# Patient Record
Sex: Female | Born: 1995 | Race: White | Hispanic: No | Marital: Single | Smoking: Never smoker
Health system: Southern US, Community
[De-identification: ages and names within clinical notes are randomized; demographics above are authoritative.]

## PROBLEM LIST (undated history)

## (undated) DIAGNOSIS — F32A Depression, unspecified: Secondary | ICD-10-CM

## (undated) DIAGNOSIS — F988 Other specified behavioral and emotional disorders with onset usually occurring in childhood and adolescence: Secondary | ICD-10-CM

## (undated) DIAGNOSIS — F329 Major depressive disorder, single episode, unspecified: Secondary | ICD-10-CM

## (undated) DIAGNOSIS — G43909 Migraine, unspecified, not intractable, without status migrainosus: Secondary | ICD-10-CM

## (undated) DIAGNOSIS — I456 Pre-excitation syndrome: Secondary | ICD-10-CM

## (undated) DIAGNOSIS — F419 Anxiety disorder, unspecified: Secondary | ICD-10-CM

## (undated) HISTORY — DX: Anxiety disorder, unspecified: F41.9

## (undated) HISTORY — DX: Major depressive disorder, single episode, unspecified: F32.9

## (undated) HISTORY — PX: WISDOM TOOTH EXTRACTION: SHX21

## (undated) HISTORY — DX: Other specified behavioral and emotional disorders with onset usually occurring in childhood and adolescence: F98.8

## (undated) HISTORY — DX: Depression, unspecified: F32.A

## (undated) HISTORY — DX: Pre-excitation syndrome: I45.6

## (undated) HISTORY — DX: Migraine, unspecified, not intractable, without status migrainosus: G43.909

---
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
## 2016-06-07 ENCOUNTER — Ambulatory Visit (INDEPENDENT_AMBULATORY_CARE_PROVIDER_SITE_OTHER): Payer: BLUE CROSS/BLUE SHIELD | Admitting: Allergy and Immunology

## 2016-06-07 ENCOUNTER — Encounter: Payer: Self-pay | Admitting: Allergy and Immunology

## 2016-06-07 VITALS — BP 104/68 | HR 82 | Temp 98.6°F | Resp 16 | Ht 63.98 in | Wt 154.3 lb

## 2016-06-07 DIAGNOSIS — H1045 Other chronic allergic conjunctivitis: Secondary | ICD-10-CM

## 2016-06-07 DIAGNOSIS — J3089 Other allergic rhinitis: Secondary | ICD-10-CM

## 2016-06-07 DIAGNOSIS — Z8669 Personal history of other diseases of the nervous system and sense organs: Secondary | ICD-10-CM | POA: Insufficient documentation

## 2016-06-07 DIAGNOSIS — R198 Other specified symptoms and signs involving the digestive system and abdomen: Secondary | ICD-10-CM | POA: Diagnosis not present

## 2016-06-07 DIAGNOSIS — H1010 Acute atopic conjunctivitis, unspecified eye: Secondary | ICD-10-CM | POA: Insufficient documentation

## 2016-06-07 MED ORDER — FLUTICASONE PROPIONATE 50 MCG/ACT NA SUSP: 2.0000 | Freq: Every day | NASAL | 5 refills | Status: AC | PRN

## 2016-06-07 MED ORDER — LEVOCETIRIZINE DIHYDROCHLORIDE 5 MG PO TABS: 5.0000 mg | ORAL_TABLET | Freq: Every day | ORAL | 5 refills | Status: DC | PRN

## 2016-06-07 MED ORDER — EPINEPHRINE 0.3 MG/0.3ML IJ SOAJ: INTRAMUSCULAR | 2 refills | Status: DC

## 2016-06-07 MED ORDER — OLOPATADINE HCL 0.7 % OP SOLN: 1.0000 [drp] | OPHTHALMIC | 5 refills | Status: DC

## 2016-06-07 NOTE — Progress Notes (Signed)
New Patient Note  RE: Chelsea Rush MRN: [id number] DOB: Jun 27, 1996 Date of Office Visit: 06/07/2016  Referring provider: No ref. provider found Primary care provider: Pcp Not In System  Chief Complaint: Allergic Rhinitis ; Conjunctivitis; Food Intolerance; and Headache   History of present illness: Chelsea Rush is a 20 y.o. female presenting today for consultation of rhinitis and possible food allergies.  She complains of nasal congestion, rhinorrhea, sneezing, nasal pruritus, watery/itchy eyes, and occasional sinus pressure between the eyes and over the cheekbones.  These symptoms occur year around but her more frequent and severe in the summer and fall.  She notes that she has had recurrent migraines since the ninth grade.  She currently experiences 2-3 migraines per week.  She believes specific triggers may include nuts, starchy foods, caffeine, and/or chocolate.  She also wonders if there are environmental triggers for her migraines.  Alara reports that she experiences abdominal cramping and bloating with the consumption of dairy products.  She does not experience concomitant cardiopulmonary or cutaneous symptoms.  She has not tried lactose-free dairy products.   Assessment and plan: Perennial and seasonal allergic rhinitis  Aeroallergen avoidance measures have been discussed and provided in written form.  A prescription has been provided for levocetirizine, 5mg  daily as needed.  A prescription has been provided for fluticasone nasal spray, 2 sprays per nostril daily as needed. Proper nasal spray technique has been discussed and demonstrated.  I have also recommended nasal saline spray (i.e., Simply Saline) or nasal saline lavage (i.e., NeilMed) as needed prior to medicated nasal sprays.  If allergen avoidance measures and medications fail to adequately relieve symptoms, aeroallergen immunotherapy will be considered.  Seasonal allergic conjunctivitis  Treatment plan as  outlined above for allergic rhinitis.  A prescription has been provided for Pazeo, one drop per eye daily as needed.  Migraine headaches  As migraines may be exacerbated or triggered by sinus symptoms, treatment plan as outlined above for allergic rhinitis.  Continue avoidance of known triggers and treatment plan as outlined by neurologist.  GI symptoms Gastrointestinal symptoms, uncertain etiology. Skin tests to select food allergens were negative today. The negative predictive value of food allergen skin testing is excellent (approximately 95%). While this does not appear to be an IgE mediated issue, skin testing does not rule out food intolerances or cell-mediated enteropathies which may lend to GI symptoms. These etiologies are suggested when elimination of the responsible food leads to symptom resolution and re-introduction of the food is followed by the return of symptoms.   The patient has been encouraged to try Lactaid products and keep a careful symptom/food journal and eliminate any food suspected of correlating with symptoms.   If GI symptoms persist or progress, gastroenterologist evaluation may be warranted.   Meds ordered this encounter  Medications  . levocetirizine (XYZAL) 5 MG tablet    Sig: Take 1 tablet (5 mg total) by mouth daily as needed for allergies.    Dispense:  30 tablet    Refill:  5  . fluticasone (FLONASE) 50 MCG/ACT nasal spray    Sig: Place 2 sprays into both nostrils daily as needed for allergies or rhinitis.    Dispense:  16 g    Refill:  5  . EPINEPHrine (EPIPEN 2-PAK) 0.3 mg/0.3 mL IJ SOAJ injection    Sig: Use as directed for severe allergic reactions    Dispense:  2 Device    Refill:  2  . Olopatadine HCl (PAZEO) 0.7 % SOLN  Sig: Place 1 drop into both eyes 1 day or 1 dose.    Dispense:  1 Bottle    Refill:  5    Diagnositics: Environmental skin testing: Positive to grass pollen, weed pollen, ragweed pollen, tree pollen, mold, cat hair,  dog epithelia, dust mite, cockroach antigen. Food allergen skin testing:  Negative despite a positive histamine control.    Physical examination: Blood pressure 104/68, pulse 82, temperature 98.6 F (37 C), temperature source Oral, resp. rate 16, height 5' 3.98" (1.625 m), weight 154 lb 5.2 oz (70 kg).  General: Alert, interactive, in no acute distress. HEENT: TMs pearly gray, turbinates markedly edematous and pale without discharge, post-pharynx mildly erythematous. Neck: Supple without lymphadenopathy. Lungs: Clear to auscultation without wheezing, rhonchi or rales. CV: Normal S1, S2 without murmurs. Abdomen: Nondistended, nontender. Skin: Warm and dry, without lesions or rashes. Extremities:  No clubbing, cyanosis or edema. Neuro:   Grossly intact.  Review of systems:  Review of systems negative except as noted in HPI / PMHx or noted below: Review of Systems  Constitutional: Negative.   HENT: Negative.   Eyes: Negative.   Respiratory: Negative.   Cardiovascular: Negative.   Gastrointestinal: Negative.   Genitourinary: Negative.   Musculoskeletal: Negative.   Skin: Negative.   Neurological: Negative.   Endo/Heme/Allergies: Negative.   Psychiatric/Behavioral: Negative.     Past medical history:  Past Medical History:  Diagnosis Date  . ADD (attention deficit disorder)   . Anxiety   . Depression   . Migraines   . Wolff-Parkinson-White syndrome     Past surgical history:  Past Surgical History:  Procedure Laterality Date  . 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 WISDOM TOOTH EXTRACTION      Family history: Family History  Problem Relation Age of Onset  . Allergic rhinitis Father   . Asthma Father   . Angioedema Neg Hx   . Eczema Neg Hx   . Immunodeficiency Neg Hx   . Urticaria Neg Hx     Social history: Social History   Social History  . Marital status: Single    Spouse name: N/A  . Number of children: N/A  . Years of education: N/A    Occupational History  . Not on file.   Social History Main Topics  . Smoking status: Never Smoker  . Smokeless tobacco: Never Used  . Alcohol use Yes  . Drug use: No  . Sexual activity: Yes   Other Topics Concern  . Not on file   Social History Narrative  . No narrative on file   Environmental History: The patient lives in a 22 year old house with hardwood floors throughout and central air/heat.  She is a nonsmoker without pets.    Medication List       Accurate as of 06/07/16  7:42 PM. Always use your most recent med list.          ABILIFY 5 MG tablet Generic drug:  ARIPiprazole Take 5 mg by mouth daily. Pt.takes one half tab daily   cetirizine 10 MG chewable tablet Commonly known as:  ZYRTEC Chew 10 mg by mouth daily.   EPINEPHrine 0.3 mg/0.3 mL Soaj injection Commonly known as:  EPIPEN 2-PAK Use as directed for severe allergic reactions   fluticasone 50 MCG/ACT nasal spray Commonly known as:  FLONASE Place 2 sprays into both nostrils daily as needed for allergies or rhinitis.   levocetirizine 5 MG tablet Commonly known as:  XYZAL Take  1 tablet (5 mg total) by mouth daily as needed for allergies.   Olopatadine HCl 0.7 % Soln Commonly known as:  PAZEO Place 1 drop into both eyes 1 day or 1 dose.   propranolol 40 MG tablet Commonly known as:  INDERAL Take 40 mg by mouth 3 (three) times daily.   SUMAtriptan 100 MG tablet Commonly known as:  IMITREX Take 100 mg by mouth every 2 (two) hours as needed for migraine. May repeat in 2 hours if headache persists or recurs.   TRINTELLIX 20 MG Tabs Generic drug:  vortioxetine HBr Take 20 mg by mouth daily.   VYVANSE 70 MG capsule Generic drug:  lisdexamfetamine Take 70 mg by mouth daily.       Known medication allergies: No Known Allergies  I appreciate the opportunity to take part in Rose's care. Please do not hesitate to contact me with questions.  Sincerely,   R. Jorene Guest, MD

## 2016-06-07 NOTE — Patient Instructions (Addendum)
Perennial and seasonal allergic rhinitis  Aeroallergen avoidance measures have been discussed and provided in written form.  A prescription has been provided for levocetirizine, 5mg  daily as needed.  A prescription has been provided for fluticasone nasal spray, 2 sprays per nostril daily as needed. Proper nasal spray technique has been discussed and demonstrated.  I have also recommended nasal saline spray (i.e., Simply Saline) or nasal saline lavage (i.e., NeilMed) as needed prior to medicated nasal sprays.  If allergen avoidance measures and medications fail to adequately relieve symptoms, aeroallergen immunotherapy will be considered.  Seasonal allergic conjunctivitis  Treatment plan as outlined above for allergic rhinitis.  A prescription has been provided for Pazeo, one drop per eye daily as needed.  Migraine headaches  As migraines may be exacerbated or triggered by sinus symptoms, treatment plan as outlined above for allergic rhinitis.  Continue avoidance of known triggers and treatment plan as outlined by neurologist.  GI symptoms Gastrointestinal symptoms, uncertain etiology. Skin tests to select food allergens were negative today. The negative predictive value of food allergen skin testing is excellent (approximately 95%). While this does not appear to be an IgE mediated issue, skin testing does not rule out food intolerances or cell-mediated enteropathies which may lend to GI symptoms. These etiologies are suggested when elimination of the responsible food leads to symptom resolution and re-introduction of the food is followed by the return of symptoms.   The patient has been encouraged to try Lactaid products and keep a careful symptom/food journal and eliminate any food suspected of correlating with symptoms.   If GI symptoms persist or progress, gastroenterologist evaluation may be warranted.   Return in about 4 months (around 10/07/2016), or if symptoms worsen or fail to  improve.  Reducing Pollen Exposure  The American Academy of Allergy, Asthma and Immunology suggests the following steps to reduce your exposure to pollen during allergy seasons.    1. Do not hang sheets or clothing out to dry; pollen may collect on these items. 2. Do not mow lawns or spend time around freshly cut grass; mowing stirs up pollen. 3. Keep windows closed at night.  Keep car windows closed while driving. 4. Minimize morning activities outdoors, a time when pollen counts are usually at their highest. 5. Stay indoors as much as possible when pollen counts or humidity is high and on windy days when pollen tends to remain in the air longer. 6. Use air conditioning when possible.  Many air conditioners have filters that trap the pollen spores. 7. Use a HEPA room air filter to remove pollen form the indoor air you breathe.   Control of House Dust Mite Allergen  House dust mites play a major role in allergic asthma and rhinitis.  They occur in environments with high humidity wherever human skin, the food for dust mites is found. High levels have been detected in dust obtained from mattresses, pillows, carpets, upholstered furniture, bed covers, clothes and soft toys.  The principal allergen of the house dust mite is found in its feces.  A gram of dust may contain 1,000 mites and 250,000 fecal particles.  Mite antigen is easily measured in the air during house cleaning activities.    1. Encase mattresses, including the box spring, and pillow, in an air tight cover.  Seal the zipper end of the encased mattresses with wide adhesive tape. 2. Wash the bedding in water of 130 degrees Farenheit weekly.  Avoid cotton comforters/quilts and flannel bedding: the most ideal bed covering  is the dacron comforter. 3. Remove all upholstered furniture from the bedroom. 4. Remove carpets, carpet padding, rugs, and non-washable window drapes from the bedroom.  Wash drapes weekly or use plastic window  coverings. 5. Remove all non-washable stuffed toys from the bedroom.  Wash stuffed toys weekly. 6. Have the room cleaned frequently with a vacuum cleaner and a damp dust-mop.  The patient should not be in a room which is being cleaned and should wait 1 hour after cleaning before going into the room. 7. Close and seal all heating outlets in the bedroom.  Otherwise, the room will become filled with dust-laden air.  An electric heater can be used to heat the room. Reduce indoor humidity to less than 50%.  Do not use a humidifier.  Control of Dog or Cat Allergen  Avoidance is the best way to manage a dog or cat allergy. If you have a dog or cat and are allergic to dog or cats, consider removing the dog or cat from the home. If you have a dog or cat but don't want to find it a new home, or if your family wants a pet even though someone in the household is allergic, here are some strategies that may help keep symptoms at bay:  1. Keep the pet out of your bedroom and restrict it to only a few rooms. Be advised that keeping the dog or cat in only one room will not limit the allergens to that room. 2. Don't pet, hug or kiss the dog or cat; if you do, wash your hands with soap and water. 3. High-efficiency particulate air (HEPA) cleaners run continuously in a bedroom or living room can reduce allergen levels over time. 4. Regular use of a high-efficiency vacuum cleaner or a central vacuum can reduce allergen levels. 5. Giving your dog or cat a bath at least once a week can reduce airborne allergen.  Control of Mold Allergen  Mold and fungi can grow on a variety of surfaces provided certain temperature and moisture conditions exist.  Outdoor molds grow on plants, decaying vegetation and soil.  The major outdoor mold, Alternaria and Cladosporium, are found in very high numbers during hot and dry conditions.  Generally, a late Summer - Fall peak is seen for common outdoor fungal spores.  Rain will temporarily  lower outdoor mold spore count, but counts rise rapidly when the rainy period ends.  The most important indoor molds are Aspergillus and Penicillium.  Dark, humid and poorly ventilated basements are ideal sites for mold growth.  The next most common sites of mold growth are the bathroom and the kitchen.  Outdoor [employer] 1. Use air conditioning and keep windows closed 2. Avoid exposure to decaying vegetation. 3. Avoid leaf raking. 4. Avoid grain handling. 5. Consider wearing a face mask if working in moldy areas.  Indoor Mold Control 1. Maintain humidity below 50%. 2. Clean washable surfaces with 5% bleach solution. 3. Remove sources e.g. Contaminated carpets.  Control of Cockroach Allergen  Cockroach allergen has been identified as an important cause of acute attacks of asthma, especially in urban settings.  There are fifty-five species of cockroach that exist in the [place], however only three, the [place], [place] species produce allergen that can affect patients with Asthma.  Allergens can be obtained from fecal particles, egg casings and secretions from cockroaches.    1. Remove food sources. 2. Reduce access to water. 3. Seal access and entry points. 4. Spray runways  with 0.5-1% Diazinon or Chlorpyrifos 5. Blow boric acid power under stoves and refrigerator. 6. Place bait stations (hydramethylnon) at feeding sites.

## 2016-06-07 NOTE — Assessment & Plan Note (Signed)
   Treatment plan as outlined above for allergic rhinitis.  A prescription has been provided for Pazeo, one drop per eye daily as needed. 

## 2016-06-07 NOTE — Assessment & Plan Note (Deleted)
Gastrointestinal symptoms, uncertain etiology. Skin tests to select food allergens were negative today. The negative predictive value of food allergen skin testing is excellent (approximately 95%). While this does not appear to be an IgE mediated issue, skin testing does not rule out food intolerances or cell-mediated enteropathies which may lend to GI symptoms. These etiologies are suggested when elimination of the responsible food leads to symptom resolution and re-introduction of the food is followed by the return of symptoms.   The patient has been encouraged to try Lactaid products and keep a careful symptom/food journal and eliminate any food suspected of correlating with symptoms.   If GI symptoms persist or progress, gastroenterologist evaluation may be warranted.

## 2016-06-07 NOTE — Assessment & Plan Note (Signed)
   Aeroallergen avoidance measures have been discussed and provided in written form.  A prescription has been provided for levocetirizine, 5mg daily as needed.  A prescription has been provided for fluticasone nasal spray, 2 sprays per nostril daily as needed. Proper nasal spray technique has been discussed and demonstrated.  I have also recommended nasal saline spray (i.e., Simply Saline) or nasal saline lavage (i.e., NeilMed) as needed prior to medicated nasal sprays.  If allergen avoidance measures and medications fail to adequately relieve symptoms, aeroallergen immunotherapy will be considered. 

## 2016-06-07 NOTE — Assessment & Plan Note (Signed)
   As migraines may be exacerbated or triggered by sinus symptoms, treatment plan as outlined above for allergic rhinitis.  Continue avoidance of known triggers and treatment plan as outlined by neurologist.

## 2016-06-07 NOTE — Assessment & Plan Note (Signed)
Gastrointestinal symptoms, uncertain etiology. Skin tests to select food allergens were negative today. The negative predictive value of food allergen skin testing is excellent (approximately 95%). While this does not appear to be an IgE mediated issue, skin testing does not rule out food intolerances or cell-mediated enteropathies which may lend to GI symptoms. These etiologies are suggested when elimination of the responsible food leads to symptom resolution and re-introduction of the food is followed by the return of symptoms.   The patient has been encouraged to try Lactaid products and keep a careful symptom/food journal and eliminate any food suspected of correlating with symptoms.   If GI symptoms persist or progress, gastroenterologist evaluation may be warranted. 

## 2016-06-09 ENCOUNTER — Other Ambulatory Visit: Payer: Self-pay

## 2016-06-09 DIAGNOSIS — H1010 Acute atopic conjunctivitis, unspecified eye: Secondary | ICD-10-CM

## 2016-06-09 MED ORDER — OLOPATADINE HCL 0.7 % OP SOLN: 1.0000 [drp] | OPHTHALMIC | 5 refills | Status: DC

## 2016-06-09 NOTE — Telephone Encounter (Signed)
Rx for Pazeo x 5 at [facility]

## 2016-06-12 NOTE — Addendum Note (Signed)
Addended by: Candis SchatzBOBBITT, Bubber Rothert C on: 06/12/2016 11:40 AM   Modules accepted: Orders

## 2016-06-13 ENCOUNTER — Telehealth: Payer: Self-pay | Admitting: Allergy

## 2016-06-13 NOTE — Telephone Encounter (Signed)
[facility] called to verify directions on Pazeo. Informed  pharmacist one drop each eye once a day.

## 2016-06-13 NOTE — Progress Notes (Unsigned)
Vials prepped 06-14-17.JM

## 2016-06-16 DIAGNOSIS — J301 Allergic rhinitis due to pollen: Secondary | ICD-10-CM | POA: Diagnosis not present

## 2016-06-17 DIAGNOSIS — J3089 Other allergic rhinitis: Secondary | ICD-10-CM | POA: Diagnosis not present

## 2016-06-22 ENCOUNTER — Ambulatory Visit: Payer: BLUE CROSS/BLUE SHIELD

## 2016-06-26 ENCOUNTER — Ambulatory Visit (INDEPENDENT_AMBULATORY_CARE_PROVIDER_SITE_OTHER): Payer: BLUE CROSS/BLUE SHIELD

## 2016-06-26 DIAGNOSIS — J309 Allergic rhinitis, unspecified: Secondary | ICD-10-CM | POA: Diagnosis not present

## 2016-06-26 NOTE — Progress Notes (Signed)
Immunotherapy   Patient Details  Name: Chelsea Rush MRN: [id number] Date of Birth: 06/07/1996  06/26/2016  [name] started injections for Blue 1:100,000 [name](Grass-Weed-Tree andMold-Mite-Cat-CR Following schedule: A  Frequency:1 time per week Epi-Pen:Epi-Pen Available  Consent signed and patient instructions given.   Virl SonDamita Gustave Lindeman 06/26/2016, 9:51 AM

## 2016-07-04 ENCOUNTER — Ambulatory Visit (INDEPENDENT_AMBULATORY_CARE_PROVIDER_SITE_OTHER): Payer: BLUE CROSS/BLUE SHIELD

## 2016-07-04 DIAGNOSIS — J309 Allergic rhinitis, unspecified: Secondary | ICD-10-CM | POA: Diagnosis not present

## 2016-07-13 ENCOUNTER — Ambulatory Visit (INDEPENDENT_AMBULATORY_CARE_PROVIDER_SITE_OTHER): Payer: BLUE CROSS/BLUE SHIELD

## 2016-07-13 DIAGNOSIS — J309 Allergic rhinitis, unspecified: Secondary | ICD-10-CM

## 2016-07-20 ENCOUNTER — Ambulatory Visit (INDEPENDENT_AMBULATORY_CARE_PROVIDER_SITE_OTHER): Payer: BLUE CROSS/BLUE SHIELD

## 2016-07-20 DIAGNOSIS — J309 Allergic rhinitis, unspecified: Secondary | ICD-10-CM

## 2016-08-08 ENCOUNTER — Ambulatory Visit (INDEPENDENT_AMBULATORY_CARE_PROVIDER_SITE_OTHER): Payer: BLUE CROSS/BLUE SHIELD

## 2016-08-08 DIAGNOSIS — J309 Allergic rhinitis, unspecified: Secondary | ICD-10-CM

## 2016-08-22 ENCOUNTER — Ambulatory Visit (INDEPENDENT_AMBULATORY_CARE_PROVIDER_SITE_OTHER): Payer: BLUE CROSS/BLUE SHIELD

## 2016-08-22 DIAGNOSIS — J309 Allergic rhinitis, unspecified: Secondary | ICD-10-CM

## 2016-08-29 ENCOUNTER — Ambulatory Visit (INDEPENDENT_AMBULATORY_CARE_PROVIDER_SITE_OTHER): Payer: BLUE CROSS/BLUE SHIELD

## 2016-08-29 DIAGNOSIS — J309 Allergic rhinitis, unspecified: Secondary | ICD-10-CM | POA: Diagnosis not present

## 2016-09-12 ENCOUNTER — Ambulatory Visit (INDEPENDENT_AMBULATORY_CARE_PROVIDER_SITE_OTHER): Payer: BLUE CROSS/BLUE SHIELD

## 2016-09-12 DIAGNOSIS — J309 Allergic rhinitis, unspecified: Secondary | ICD-10-CM | POA: Diagnosis not present

## 2016-09-21 ENCOUNTER — Ambulatory Visit (INDEPENDENT_AMBULATORY_CARE_PROVIDER_SITE_OTHER): Payer: BLUE CROSS/BLUE SHIELD

## 2016-09-21 DIAGNOSIS — J309 Allergic rhinitis, unspecified: Secondary | ICD-10-CM | POA: Diagnosis not present

## 2016-10-11 ENCOUNTER — Encounter: Payer: Self-pay | Admitting: Allergy and Immunology

## 2016-10-11 ENCOUNTER — Ambulatory Visit: Payer: Self-pay | Admitting: *Deleted

## 2016-10-11 ENCOUNTER — Ambulatory Visit (INDEPENDENT_AMBULATORY_CARE_PROVIDER_SITE_OTHER): Payer: BLUE CROSS/BLUE SHIELD | Admitting: Allergy and Immunology

## 2016-10-11 DIAGNOSIS — J3089 Other allergic rhinitis: Secondary | ICD-10-CM

## 2016-10-11 DIAGNOSIS — H1045 Other chronic allergic conjunctivitis: Secondary | ICD-10-CM | POA: Diagnosis not present

## 2016-10-11 DIAGNOSIS — H1010 Acute atopic conjunctivitis, unspecified eye: Secondary | ICD-10-CM

## 2016-10-11 DIAGNOSIS — J309 Allergic rhinitis, unspecified: Secondary | ICD-10-CM

## 2016-10-11 NOTE — Assessment & Plan Note (Signed)
   Treatment plan as outlined above.  Continue olopatadine eyedrops as needed.

## 2016-10-11 NOTE — Patient Instructions (Addendum)
Perennial and seasonal allergic rhinitis  Continue appropriate allergen avoidance measures, aeroallergen immunotherapy as prescribed and as tolerated, levocetirizine 5 g daily as needed, and fluticasone nasal spray as needed.  I have encouraged the use of nasal saline spray (i.e., Simply Saline) or nasal saline lavage (i.e., NeilMed) as needed prior to medicated nasal sprays.  Medications will be decreased or discontinued as symptom relief from immunotherapy becomes evident.  Seasonal allergic conjunctivitis  Treatment plan as outlined above.  Continue olopatadine eyedrops as needed.   Return in about 6 months (around 04/10/2017), or if symptoms worsen or fail to improve.

## 2016-10-11 NOTE — Progress Notes (Signed)
    Follow-up Note  RE: Chelsea Rush Rush MRN: [id number] DOB: 1996-02-24 Date of Office Visit: 10/11/2016  Primary care provider: Pcp Not In System Referring provider: No ref. provider found  History of present illness: Chelsea Rush is a 21 y.o. female with allergic rhinoconjunctivitis presents today for follow up.  She was previously seen in this clinic for her initial evaluation on 06/07/2016.  She reports that overall she has experienced symptom reduction, however still she experiences occasional nasal congestion.  She admits that she is not using nasal saline irrigation as recommended.  She is tolerating aeroallergen immunotherapy buildup injections without problems or complications.   Assessment and plan: Perennial and seasonal allergic rhinitis  Continue appropriate allergen avoidance measures, aeroallergen immunotherapy as prescribed and as tolerated, levocetirizine 5 g daily as needed, and fluticasone nasal spray as needed.  I have encouraged the use of nasal saline spray (i.e., Simply Saline) or nasal saline lavage (i.e., NeilMed) as needed prior to medicated nasal sprays.  Medications will be decreased or discontinued as symptom relief from immunotherapy becomes evident.  Seasonal allergic conjunctivitis  Treatment plan as outlined above.  Continue olopatadine eyedrops as needed.     Physical examination: Blood pressure 104/70, pulse 96, temperature 98.8 F (37.1 C), temperature source Oral, resp. rate 16.  General: Alert, interactive, in no acute distress. HEENT: TMs pearly gray, turbinates mildly edematous without discharge, post-pharynx unremarkable. Neck: Supple without lymphadenopathy. Lungs: Clear to auscultation without wheezing, rhonchi or rales. CV: Normal S1, S2 without murmurs. Skin: Warm and dry, without lesions or rashes.  The following portions of the patient's history were reviewed and updated as appropriate: allergies, current medications, past  family history, past medical history, past social history, past surgical history and problem list.  Allergies as of 10/11/2016   No Known Allergies     Medication List       Accurate as of 10/11/16  1:32 PM. Always use your most recent med list.          ABILIFY 5 MG tablet Generic drug:  ARIPiprazole Take 5 mg by mouth daily. Pt.takes one half tab daily   Biotin 1000 MCG tablet Take by mouth.   EPINEPHrine 0.3 mg/0.3 mL Soaj injection Commonly known as:  EPIPEN 2-PAK Use as directed for severe allergic reactions   fluticasone 50 MCG/ACT nasal spray Commonly known as:  FLONASE Place 2 sprays into both nostrils daily as needed for allergies or rhinitis.   levocetirizine 5 MG tablet Commonly known as:  XYZAL Take 1 tablet (5 mg total) by mouth daily as needed for allergies.   Olopatadine HCl 0.7 % Soln Commonly known as:  PAZEO Place 1 drop into both eyes 1 day or 1 dose.   propranolol 40 MG tablet Commonly known as:  INDERAL Take 40 mg by mouth 3 (three) times daily.   SUMAtriptan 100 MG tablet Commonly known as:  IMITREX Take 100 mg by mouth every 2 (two) hours as needed for migraine. May repeat in 2 hours if headache persists or recurs.   TRINTELLIX 20 MG Tabs Generic drug:  vortioxetine HBr Take 20 mg by mouth daily.   VYVANSE 70 MG capsule Generic drug:  lisdexamfetamine Take 70 mg by mouth daily.       No Known Allergies  I appreciate the opportunity to take part in Chondra's care. Please do not hesitate to contact me with questions.  Sincerely,   R. Jorene Guestarter Irvin Bastin, MD

## 2016-10-11 NOTE — Assessment & Plan Note (Addendum)
   Continue appropriate allergen avoidance measures, aeroallergen immunotherapy as prescribed and as tolerated, levocetirizine 5 g daily as needed, and fluticasone nasal spray as needed.  I have encouraged the use of nasal saline spray (i.e., Simply Saline) or nasal saline lavage (i.e., NeilMed) as needed prior to medicated nasal sprays.  Medications will be decreased or discontinued as symptom relief from immunotherapy becomes evident.

## 2016-10-23 ENCOUNTER — Ambulatory Visit (INDEPENDENT_AMBULATORY_CARE_PROVIDER_SITE_OTHER): Payer: BLUE CROSS/BLUE SHIELD

## 2016-10-23 DIAGNOSIS — J309 Allergic rhinitis, unspecified: Secondary | ICD-10-CM | POA: Diagnosis not present

## 2016-10-25 NOTE — Addendum Note (Signed)
Addended by: Maryjean MornFREEMAN, LOGAN D on: 10/25/2016 11:33 AM   Modules accepted: Orders

## 2016-11-09 ENCOUNTER — Ambulatory Visit (INDEPENDENT_AMBULATORY_CARE_PROVIDER_SITE_OTHER): Payer: BLUE CROSS/BLUE SHIELD

## 2016-11-09 DIAGNOSIS — J309 Allergic rhinitis, unspecified: Secondary | ICD-10-CM | POA: Diagnosis not present

## 2016-11-13 ENCOUNTER — Ambulatory Visit (INDEPENDENT_AMBULATORY_CARE_PROVIDER_SITE_OTHER): Payer: BLUE CROSS/BLUE SHIELD

## 2016-11-13 DIAGNOSIS — J309 Allergic rhinitis, unspecified: Secondary | ICD-10-CM | POA: Diagnosis not present

## 2016-11-20 ENCOUNTER — Ambulatory Visit (INDEPENDENT_AMBULATORY_CARE_PROVIDER_SITE_OTHER): Payer: BLUE CROSS/BLUE SHIELD

## 2016-11-20 DIAGNOSIS — J309 Allergic rhinitis, unspecified: Secondary | ICD-10-CM | POA: Diagnosis not present

## 2016-11-29 ENCOUNTER — Ambulatory Visit (INDEPENDENT_AMBULATORY_CARE_PROVIDER_SITE_OTHER): Payer: BLUE CROSS/BLUE SHIELD

## 2016-11-29 DIAGNOSIS — J309 Allergic rhinitis, unspecified: Secondary | ICD-10-CM

## 2016-12-11 ENCOUNTER — Ambulatory Visit (INDEPENDENT_AMBULATORY_CARE_PROVIDER_SITE_OTHER): Payer: BLUE CROSS/BLUE SHIELD

## 2016-12-11 ENCOUNTER — Telehealth: Payer: Self-pay | Admitting: Pediatrics

## 2016-12-11 ENCOUNTER — Telehealth: Payer: Self-pay

## 2016-12-11 DIAGNOSIS — J309 Allergic rhinitis, unspecified: Secondary | ICD-10-CM | POA: Diagnosis not present

## 2016-12-11 NOTE — Telephone Encounter (Signed)
She may proceed with scheduled B for the blue vial and then return to schedule A thereafter. Thanks.

## 2016-12-11 NOTE — Telephone Encounter (Signed)
[facility] will mail out

## 2016-12-11 NOTE — Telephone Encounter (Signed)
Dr. Nunzio CobbsBobbitt this pt. Has to repeat her blue vials. Pt. Has had to miss injections bc of sickness and not bc of reactions. Can she do the B schedule just on this blue vial? I asked the pt. If she had any problems and she stated no, but She did say she had a little hive at one time. Do you want to keep her at Schedule A bc of having that little hive?

## 2016-12-11 NOTE — Telephone Encounter (Signed)
Please mail out an itemized statement for the last billing cycle that includes an approximate amount of $56.00 in it. Thanks

## 2016-12-18 ENCOUNTER — Ambulatory Visit (INDEPENDENT_AMBULATORY_CARE_PROVIDER_SITE_OTHER): Payer: BLUE CROSS/BLUE SHIELD

## 2016-12-18 DIAGNOSIS — J309 Allergic rhinitis, unspecified: Secondary | ICD-10-CM

## 2016-12-27 ENCOUNTER — Ambulatory Visit (INDEPENDENT_AMBULATORY_CARE_PROVIDER_SITE_OTHER): Payer: BLUE CROSS/BLUE SHIELD

## 2016-12-27 DIAGNOSIS — J309 Allergic rhinitis, unspecified: Secondary | ICD-10-CM | POA: Diagnosis not present

## 2016-12-28 ENCOUNTER — Other Ambulatory Visit: Payer: Self-pay | Admitting: Allergy and Immunology

## 2016-12-28 DIAGNOSIS — H1010 Acute atopic conjunctivitis, unspecified eye: Secondary | ICD-10-CM

## 2016-12-28 DIAGNOSIS — J3089 Other allergic rhinitis: Secondary | ICD-10-CM

## 2017-01-04 ENCOUNTER — Ambulatory Visit (INDEPENDENT_AMBULATORY_CARE_PROVIDER_SITE_OTHER): Payer: BLUE CROSS/BLUE SHIELD

## 2017-01-04 DIAGNOSIS — J309 Allergic rhinitis, unspecified: Secondary | ICD-10-CM

## 2017-01-10 ENCOUNTER — Ambulatory Visit (INDEPENDENT_AMBULATORY_CARE_PROVIDER_SITE_OTHER): Payer: BLUE CROSS/BLUE SHIELD

## 2017-01-10 DIAGNOSIS — J309 Allergic rhinitis, unspecified: Secondary | ICD-10-CM | POA: Diagnosis not present

## 2017-01-18 ENCOUNTER — Ambulatory Visit (INDEPENDENT_AMBULATORY_CARE_PROVIDER_SITE_OTHER): Payer: BLUE CROSS/BLUE SHIELD

## 2017-01-18 DIAGNOSIS — J309 Allergic rhinitis, unspecified: Secondary | ICD-10-CM

## 2017-01-23 ENCOUNTER — Telehealth: Payer: Self-pay | Admitting: *Deleted

## 2017-01-23 NOTE — Telephone Encounter (Signed)
Pt father would like a return call.

## 2017-01-24 ENCOUNTER — Ambulatory Visit (INDEPENDENT_AMBULATORY_CARE_PROVIDER_SITE_OTHER): Payer: BLUE CROSS/BLUE SHIELD

## 2017-01-24 ENCOUNTER — Telehealth: Payer: Self-pay | Admitting: Pediatrics

## 2017-01-24 DIAGNOSIS — J309 Allergic rhinitis, unspecified: Secondary | ICD-10-CM

## 2017-01-24 NOTE — Telephone Encounter (Signed)
Dad has not been getting the bill - asked [name] to correct the address on the insurance - he says Chelsea Rush will pay today when she comes in for her inj

## 2017-01-24 NOTE — Telephone Encounter (Signed)
Pt just paid off payment. Please mail an itemized statement to patient at address on file. I have corrected it to the [place], [place] address. Thanks

## 2017-01-24 NOTE — Telephone Encounter (Signed)
Statement has been printed and will be mailed on 01/25/2017.

## 2017-02-07 ENCOUNTER — Ambulatory Visit (INDEPENDENT_AMBULATORY_CARE_PROVIDER_SITE_OTHER): Payer: BLUE CROSS/BLUE SHIELD | Admitting: *Deleted

## 2017-02-07 DIAGNOSIS — J309 Allergic rhinitis, unspecified: Secondary | ICD-10-CM

## 2017-02-13 ENCOUNTER — Ambulatory Visit (INDEPENDENT_AMBULATORY_CARE_PROVIDER_SITE_OTHER): Payer: BLUE CROSS/BLUE SHIELD

## 2017-02-13 DIAGNOSIS — J309 Allergic rhinitis, unspecified: Secondary | ICD-10-CM | POA: Diagnosis not present

## 2017-02-19 ENCOUNTER — Ambulatory Visit (INDEPENDENT_AMBULATORY_CARE_PROVIDER_SITE_OTHER): Payer: BLUE CROSS/BLUE SHIELD

## 2017-02-19 DIAGNOSIS — J309 Allergic rhinitis, unspecified: Secondary | ICD-10-CM | POA: Diagnosis not present

## 2017-02-28 ENCOUNTER — Ambulatory Visit (INDEPENDENT_AMBULATORY_CARE_PROVIDER_SITE_OTHER): Payer: BLUE CROSS/BLUE SHIELD

## 2017-02-28 DIAGNOSIS — J309 Allergic rhinitis, unspecified: Secondary | ICD-10-CM

## 2017-03-05 ENCOUNTER — Ambulatory Visit (INDEPENDENT_AMBULATORY_CARE_PROVIDER_SITE_OTHER): Payer: BLUE CROSS/BLUE SHIELD

## 2017-03-05 DIAGNOSIS — J309 Allergic rhinitis, unspecified: Secondary | ICD-10-CM

## 2017-03-12 ENCOUNTER — Ambulatory Visit (INDEPENDENT_AMBULATORY_CARE_PROVIDER_SITE_OTHER): Payer: BLUE CROSS/BLUE SHIELD

## 2017-03-12 DIAGNOSIS — J309 Allergic rhinitis, unspecified: Secondary | ICD-10-CM

## 2017-03-19 ENCOUNTER — Ambulatory Visit (INDEPENDENT_AMBULATORY_CARE_PROVIDER_SITE_OTHER): Payer: BLUE CROSS/BLUE SHIELD

## 2017-03-19 DIAGNOSIS — J309 Allergic rhinitis, unspecified: Secondary | ICD-10-CM

## 2017-04-19 ENCOUNTER — Ambulatory Visit (INDEPENDENT_AMBULATORY_CARE_PROVIDER_SITE_OTHER): Payer: BLUE CROSS/BLUE SHIELD

## 2017-04-19 DIAGNOSIS — J309 Allergic rhinitis, unspecified: Secondary | ICD-10-CM

## 2017-04-25 ENCOUNTER — Ambulatory Visit: Payer: BLUE CROSS/BLUE SHIELD | Admitting: Allergy and Immunology

## 2017-04-25 ENCOUNTER — Ambulatory Visit (INDEPENDENT_AMBULATORY_CARE_PROVIDER_SITE_OTHER): Payer: BLUE CROSS/BLUE SHIELD

## 2017-04-25 DIAGNOSIS — J309 Allergic rhinitis, unspecified: Secondary | ICD-10-CM

## 2017-04-30 ENCOUNTER — Ambulatory Visit (INDEPENDENT_AMBULATORY_CARE_PROVIDER_SITE_OTHER): Payer: BLUE CROSS/BLUE SHIELD

## 2017-04-30 DIAGNOSIS — J309 Allergic rhinitis, unspecified: Secondary | ICD-10-CM | POA: Diagnosis not present

## 2017-05-17 ENCOUNTER — Ambulatory Visit (INDEPENDENT_AMBULATORY_CARE_PROVIDER_SITE_OTHER): Payer: BLUE CROSS/BLUE SHIELD | Admitting: *Deleted

## 2017-05-17 DIAGNOSIS — J309 Allergic rhinitis, unspecified: Secondary | ICD-10-CM | POA: Diagnosis not present

## 2017-05-22 ENCOUNTER — Ambulatory Visit (INDEPENDENT_AMBULATORY_CARE_PROVIDER_SITE_OTHER): Payer: BLUE CROSS/BLUE SHIELD

## 2017-05-22 DIAGNOSIS — J309 Allergic rhinitis, unspecified: Secondary | ICD-10-CM | POA: Diagnosis not present

## 2017-05-31 ENCOUNTER — Ambulatory Visit (INDEPENDENT_AMBULATORY_CARE_PROVIDER_SITE_OTHER): Payer: BLUE CROSS/BLUE SHIELD

## 2017-05-31 DIAGNOSIS — J309 Allergic rhinitis, unspecified: Secondary | ICD-10-CM | POA: Diagnosis not present

## 2017-06-06 DIAGNOSIS — J301 Allergic rhinitis due to pollen: Secondary | ICD-10-CM | POA: Diagnosis not present

## 2017-06-07 ENCOUNTER — Ambulatory Visit (INDEPENDENT_AMBULATORY_CARE_PROVIDER_SITE_OTHER): Payer: BLUE CROSS/BLUE SHIELD

## 2017-06-07 DIAGNOSIS — J309 Allergic rhinitis, unspecified: Secondary | ICD-10-CM | POA: Diagnosis not present

## 2017-06-08 DIAGNOSIS — J3089 Other allergic rhinitis: Secondary | ICD-10-CM | POA: Diagnosis not present

## 2017-06-13 ENCOUNTER — Ambulatory Visit (INDEPENDENT_AMBULATORY_CARE_PROVIDER_SITE_OTHER): Payer: BLUE CROSS/BLUE SHIELD | Admitting: *Deleted

## 2017-06-13 ENCOUNTER — Other Ambulatory Visit (HOSPITAL_BASED_OUTPATIENT_CLINIC_OR_DEPARTMENT_OTHER): Payer: Self-pay | Admitting: Chiropractic Medicine

## 2017-06-13 ENCOUNTER — Ambulatory Visit (HOSPITAL_BASED_OUTPATIENT_CLINIC_OR_DEPARTMENT_OTHER)
Admission: RE | Admit: 2017-06-13 | Discharge: 2017-06-13 | Disposition: A | Discharge status: Home / Self Care | Payer: BLUE CROSS/BLUE SHIELD | Source: Ambulatory Visit | Attending: Chiropractic Medicine | Admitting: Chiropractic Medicine

## 2017-06-13 DIAGNOSIS — M25559 Pain in unspecified hip: Secondary | ICD-10-CM | POA: Diagnosis present

## 2017-06-13 DIAGNOSIS — R102 Pelvic and perineal pain: Secondary | ICD-10-CM

## 2017-06-13 DIAGNOSIS — J309 Allergic rhinitis, unspecified: Secondary | ICD-10-CM | POA: Diagnosis not present

## 2017-06-13 DIAGNOSIS — M545 Low back pain: Secondary | ICD-10-CM | POA: Diagnosis not present

## 2017-06-18 ENCOUNTER — Ambulatory Visit (INDEPENDENT_AMBULATORY_CARE_PROVIDER_SITE_OTHER): Payer: BLUE CROSS/BLUE SHIELD | Admitting: *Deleted

## 2017-06-18 DIAGNOSIS — J309 Allergic rhinitis, unspecified: Secondary | ICD-10-CM

## 2017-06-28 ENCOUNTER — Ambulatory Visit (INDEPENDENT_AMBULATORY_CARE_PROVIDER_SITE_OTHER): Payer: BLUE CROSS/BLUE SHIELD | Admitting: *Deleted

## 2017-06-28 DIAGNOSIS — J309 Allergic rhinitis, unspecified: Secondary | ICD-10-CM

## 2017-07-04 ENCOUNTER — Ambulatory Visit (INDEPENDENT_AMBULATORY_CARE_PROVIDER_SITE_OTHER): Payer: BLUE CROSS/BLUE SHIELD | Admitting: Allergy and Immunology

## 2017-07-04 ENCOUNTER — Encounter: Payer: Self-pay | Admitting: Allergy and Immunology

## 2017-07-04 ENCOUNTER — Ambulatory Visit: Payer: Self-pay

## 2017-07-04 DIAGNOSIS — H1010 Acute atopic conjunctivitis, unspecified eye: Secondary | ICD-10-CM | POA: Diagnosis not present

## 2017-07-04 DIAGNOSIS — J3089 Other allergic rhinitis: Secondary | ICD-10-CM | POA: Diagnosis not present

## 2017-07-04 DIAGNOSIS — J309 Allergic rhinitis, unspecified: Secondary | ICD-10-CM

## 2017-07-04 MED ORDER — OLOPATADINE HCL 0.7 % OP SOLN: 1.0000 [drp] | Freq: Every day | OPHTHALMIC | 3 refills | Status: AC | PRN

## 2017-07-04 MED ORDER — AZELASTINE HCL 0.1 % NA SOLN: NASAL | 3 refills | Status: AC

## 2017-07-04 MED ORDER — LEVOCETIRIZINE DIHYDROCHLORIDE 5 MG PO TABS: 5.0000 mg | ORAL_TABLET | Freq: Every day | ORAL | 3 refills | Status: AC | PRN

## 2017-07-04 NOTE — Assessment & Plan Note (Signed)
   Treatment plan as outlined above.  Continue olopatadine eyedrops as needed.

## 2017-07-04 NOTE — Patient Instructions (Signed)
Perennial and seasonal allergic rhinitis  Continue appropriate allergen avoidance measures, aeroallergen immunotherapy as prescribed and as tolerated, and levocetirizine 5 g daily as needed.  A prescription has been provided for azelastine nasal spray, 1-2 sprays per nostril 2 times daily as needed. Proper nasal spray technique has been discussed and demonstrated.   I have encouraged the use of nasal saline spray (i.e., Simply Saline) or nasal saline lavage (i.e., NeilMed) as needed prior to medicated nasal sprays.  Medications will be decreased or discontinued as symptom relief from immunotherapy becomes evident.  Seasonal allergic conjunctivitis  Treatment plan as outlined above.  Continue olopatadine eyedrops as needed.   Return in about 1 year (around 07/04/2018), or if symptoms worsen or fail to improve.

## 2017-07-04 NOTE — Assessment & Plan Note (Signed)
   Continue appropriate allergen avoidance measures, aeroallergen immunotherapy as prescribed and as tolerated, and levocetirizine 5 g daily as needed.  A prescription has been provided for azelastine nasal spray, 1-2 sprays per nostril 2 times daily as needed. Proper nasal spray technique has been discussed and demonstrated.   I have encouraged the use of nasal saline spray (i.e., Simply Saline) or nasal saline lavage (i.e., NeilMed) as needed prior to medicated nasal sprays.  Medications will be decreased or discontinued as symptom relief from immunotherapy becomes evident.

## 2017-07-04 NOTE — Progress Notes (Signed)
Follow-up Note  RE: Chelsea Rush MRN: [id number] DOB: 01-27-1996 Date of Office Visit: 07/04/2017  Primary care provider: Arlan Organ, MD Referring provider: No ref. provider found  History of present illness: Chelsea Rush is a 21 y.o. female with allergic rhinoconjunctivitis presents today for follow up.  She was last seen in this clinic in January 2018.  With the exception of 1 moderately large local reaction earlier this year, she has received aeroallergen immunotherapy injections without problems or complications.  She reports that she had to discontinue fluticasone nasal spray due to epistaxis.  She admits that she has not been using nasal saline spray.  As a result of being off the fluticasone nasal spray she has noticed some increased nasal congestion this fall.   Assessment and plan: Perennial and seasonal allergic rhinitis  Continue appropriate allergen avoidance measures, aeroallergen immunotherapy as prescribed and as tolerated, and levocetirizine 5 g daily as needed.  A prescription has been provided for azelastine nasal spray, 1-2 sprays per nostril 2 times daily as needed. Proper nasal spray technique has been discussed and demonstrated.   I have encouraged the use of nasal saline spray (i.e., Simply Saline) or nasal saline lavage (i.e., NeilMed) as needed prior to medicated nasal sprays.  Medications will be decreased or discontinued as symptom relief from immunotherapy becomes evident.  Seasonal allergic conjunctivitis  Treatment plan as outlined above.  Continue olopatadine eyedrops as needed.   Meds ordered this encounter  Medications  . azelastine (ASTELIN) 0.1 % nasal spray    Sig: 1 spray per nostril twice daily for stuffy nose.    Dispense:  90 mL    Refill:  3    Dispense 90 day supply.  . Olopatadine HCl 0.7 % SOLN    Sig: Apply 1 drop to eye daily as needed.    Dispense:  3 Bottle    Refill:  3    Dispense 90 day supply  .  levocetirizine (XYZAL) 5 MG tablet    Sig: Take 1 tablet (5 mg total) by mouth daily as needed for allergies.    Dispense:  90 tablet    Refill:  3    Dispense 90 day supply    Physical examination: Blood pressure 124/80, pulse 98, temperature 98 F (36.7 C), temperature source Oral, resp. rate 20, SpO2 98 %.  General: Alert, interactive, in no acute distress. HEENT: TMs pearly gray, turbinates mildly edematous without discharge, post-pharynx unremarkable. Neck: Supple without lymphadenopathy. Lungs: Clear to auscultation without wheezing, rhonchi or rales. CV: Normal S1, S2 without murmurs. Skin: Warm and dry, without lesions or rashes.  The following portions of the patient's history were reviewed and updated as appropriate: allergies, current medications, past family history, past medical history, past social history, past surgical history and problem list.  Allergies as of 07/04/2017   No Known Allergies     Medication List       Accurate as of 07/04/17  1:46 PM. Always use your most recent med list.          ABILIFY 5 MG tablet Generic drug:  ARIPiprazole Take 5 mg by mouth daily. Pt.takes one half tab daily   azelastine 0.1 % nasal spray Commonly known as:  ASTELIN 1 spray per nostril twice daily for stuffy nose.   Biotin 1000 MCG tablet Take by mouth.   Coenzyme Q10 100 MG capsule Take by mouth.   divalproex 250 MG 24 hr tablet Commonly known as:  DEPAKOTE ER TAKE  2 TABLETS (  TOTAL) BY MOUTH ONCE DAILY   EPINEPHrine 0.3 mg/0.3 mL Soaj injection Commonly known as:  EPI-PEN Use as directed for severe allergic reactions   FEVERFEW PO Take by mouth 2 (two) times daily. For migraines   fluticasone 50 MCG/ACT nasal spray Commonly known as:  FLONASE Place 2 sprays into both nostrils daily as needed for allergies or rhinitis.   levocetirizine 5 MG tablet Commonly known as:  XYZAL Take 1 tablet (5 mg total) by mouth daily as needed for allergies.     levonorgestrel 20 MCG/24HR IUD Commonly known as:  MIRENA by Intrauterine route.   lisdexamfetamine 70 MG capsule Commonly known as:  VYVANSE Take by mouth. Start taking on:  08/20/2017   lurasidone 20 MG Tabs tablet Commonly known as:  LATUDA Take by mouth.   MAGNESIUM PO Take by mouth.   NON FORMULARY 2 injections weekly.   Olopatadine HCl 0.7 % Soln Apply 1 drop to eye daily as needed.   POTASSIUM PO Take by mouth.   promethazine 25 MG tablet Commonly known as:  PHENERGAN TAKE ONE TABLET BY MOUTH EVERY 6 HOURS AS NEEDED FOR NAUSEA   propranolol 40 MG tablet Commonly known as:  INDERAL Take 40 mg by mouth 3 (three) times daily.   Riboflavin 400 MG Caps Take by mouth.   SUMAtriptan 100 MG tablet Commonly known as:  IMITREX One tablet by mouth as needed for migraine. May repeat in 2 hours if needed. No more than 2 tablets per 24 hours.   VITAMIN D3 PO Take by mouth.   vortioxetine HBr 20 MG Tabs Commonly known as:  TRINTELLIX Take 20 mg by mouth.   ZINC PO Take by mouth.       No Known Allergies  I appreciate the opportunity to take part in Chelsea Rush's care. Please do not hesitate to contact me with questions.  Sincerely,   R. Jorene Guest, MD

## 2017-07-11 ENCOUNTER — Ambulatory Visit (INDEPENDENT_AMBULATORY_CARE_PROVIDER_SITE_OTHER): Payer: BLUE CROSS/BLUE SHIELD

## 2017-07-11 DIAGNOSIS — J309 Allergic rhinitis, unspecified: Secondary | ICD-10-CM | POA: Diagnosis not present

## 2017-07-19 ENCOUNTER — Ambulatory Visit (INDEPENDENT_AMBULATORY_CARE_PROVIDER_SITE_OTHER): Payer: BLUE CROSS/BLUE SHIELD | Admitting: *Deleted

## 2017-07-19 DIAGNOSIS — J309 Allergic rhinitis, unspecified: Secondary | ICD-10-CM

## 2017-08-27 ENCOUNTER — Telehealth: Payer: Self-pay | Admitting: *Deleted

## 2017-08-27 ENCOUNTER — Ambulatory Visit (INDEPENDENT_AMBULATORY_CARE_PROVIDER_SITE_OTHER): Payer: BLUE CROSS/BLUE SHIELD

## 2017-08-27 DIAGNOSIS — J309 Allergic rhinitis, unspecified: Secondary | ICD-10-CM | POA: Diagnosis not present

## 2017-08-27 NOTE — Telephone Encounter (Signed)
Patient dad is requesting a print out from Sept 5th and Sept 7th. One amount is $420.87 and the other is $141.00.   He would like them mailed to him at [facility] [place] [id number]

## 2017-08-30 NOTE — Telephone Encounter (Signed)
Printed out and mailed 3 days ago, forgot to note and complete ticket.

## 2017-09-04 ENCOUNTER — Ambulatory Visit (INDEPENDENT_AMBULATORY_CARE_PROVIDER_SITE_OTHER): Payer: BLUE CROSS/BLUE SHIELD

## 2017-09-04 DIAGNOSIS — J309 Allergic rhinitis, unspecified: Secondary | ICD-10-CM

## 2017-09-13 ENCOUNTER — Ambulatory Visit (INDEPENDENT_AMBULATORY_CARE_PROVIDER_SITE_OTHER): Payer: BLUE CROSS/BLUE SHIELD

## 2017-09-13 DIAGNOSIS — J309 Allergic rhinitis, unspecified: Secondary | ICD-10-CM

## 2017-09-20 ENCOUNTER — Ambulatory Visit (INDEPENDENT_AMBULATORY_CARE_PROVIDER_SITE_OTHER): Payer: BLUE CROSS/BLUE SHIELD

## 2017-09-20 DIAGNOSIS — J309 Allergic rhinitis, unspecified: Secondary | ICD-10-CM

## 2017-10-08 ENCOUNTER — Ambulatory Visit (INDEPENDENT_AMBULATORY_CARE_PROVIDER_SITE_OTHER): Payer: BLUE CROSS/BLUE SHIELD

## 2017-10-08 DIAGNOSIS — J309 Allergic rhinitis, unspecified: Secondary | ICD-10-CM | POA: Diagnosis not present

## 2017-10-18 ENCOUNTER — Ambulatory Visit (INDEPENDENT_AMBULATORY_CARE_PROVIDER_SITE_OTHER): Payer: BLUE CROSS/BLUE SHIELD

## 2017-10-18 DIAGNOSIS — J309 Allergic rhinitis, unspecified: Secondary | ICD-10-CM

## 2017-10-25 ENCOUNTER — Ambulatory Visit (INDEPENDENT_AMBULATORY_CARE_PROVIDER_SITE_OTHER): Payer: BLUE CROSS/BLUE SHIELD | Admitting: *Deleted

## 2017-10-25 DIAGNOSIS — J309 Allergic rhinitis, unspecified: Secondary | ICD-10-CM | POA: Diagnosis not present

## 2017-11-12 ENCOUNTER — Ambulatory Visit (INDEPENDENT_AMBULATORY_CARE_PROVIDER_SITE_OTHER): Payer: BLUE CROSS/BLUE SHIELD

## 2017-11-12 DIAGNOSIS — J309 Allergic rhinitis, unspecified: Secondary | ICD-10-CM

## 2017-11-30 ENCOUNTER — Ambulatory Visit (INDEPENDENT_AMBULATORY_CARE_PROVIDER_SITE_OTHER): Payer: BLUE CROSS/BLUE SHIELD

## 2017-11-30 DIAGNOSIS — J309 Allergic rhinitis, unspecified: Secondary | ICD-10-CM

## 2017-12-03 ENCOUNTER — Ambulatory Visit (INDEPENDENT_AMBULATORY_CARE_PROVIDER_SITE_OTHER): Payer: BLUE CROSS/BLUE SHIELD

## 2017-12-03 DIAGNOSIS — J309 Allergic rhinitis, unspecified: Secondary | ICD-10-CM

## 2017-12-10 ENCOUNTER — Ambulatory Visit (INDEPENDENT_AMBULATORY_CARE_PROVIDER_SITE_OTHER): Payer: BLUE CROSS/BLUE SHIELD

## 2017-12-10 DIAGNOSIS — J309 Allergic rhinitis, unspecified: Secondary | ICD-10-CM | POA: Diagnosis not present

## 2017-12-19 ENCOUNTER — Ambulatory Visit (INDEPENDENT_AMBULATORY_CARE_PROVIDER_SITE_OTHER): Payer: BLUE CROSS/BLUE SHIELD | Admitting: *Deleted

## 2017-12-19 DIAGNOSIS — J309 Allergic rhinitis, unspecified: Secondary | ICD-10-CM

## 2017-12-28 ENCOUNTER — Ambulatory Visit (INDEPENDENT_AMBULATORY_CARE_PROVIDER_SITE_OTHER): Payer: BLUE CROSS/BLUE SHIELD

## 2017-12-28 DIAGNOSIS — J309 Allergic rhinitis, unspecified: Secondary | ICD-10-CM

## 2018-01-01 ENCOUNTER — Ambulatory Visit (INDEPENDENT_AMBULATORY_CARE_PROVIDER_SITE_OTHER): Payer: BLUE CROSS/BLUE SHIELD

## 2018-01-01 DIAGNOSIS — J309 Allergic rhinitis, unspecified: Secondary | ICD-10-CM

## 2018-01-11 ENCOUNTER — Ambulatory Visit (INDEPENDENT_AMBULATORY_CARE_PROVIDER_SITE_OTHER): Payer: BLUE CROSS/BLUE SHIELD | Admitting: *Deleted

## 2018-01-11 DIAGNOSIS — J309 Allergic rhinitis, unspecified: Secondary | ICD-10-CM

## 2018-01-15 ENCOUNTER — Ambulatory Visit (INDEPENDENT_AMBULATORY_CARE_PROVIDER_SITE_OTHER): Payer: BLUE CROSS/BLUE SHIELD

## 2018-01-15 DIAGNOSIS — J309 Allergic rhinitis, unspecified: Secondary | ICD-10-CM | POA: Diagnosis not present

## 2018-01-25 ENCOUNTER — Ambulatory Visit (INDEPENDENT_AMBULATORY_CARE_PROVIDER_SITE_OTHER): Payer: BLUE CROSS/BLUE SHIELD

## 2018-01-25 DIAGNOSIS — J309 Allergic rhinitis, unspecified: Secondary | ICD-10-CM

## 2018-02-05 ENCOUNTER — Ambulatory Visit (INDEPENDENT_AMBULATORY_CARE_PROVIDER_SITE_OTHER): Payer: BLUE CROSS/BLUE SHIELD

## 2018-02-05 DIAGNOSIS — J309 Allergic rhinitis, unspecified: Secondary | ICD-10-CM | POA: Diagnosis not present

## 2018-02-15 ENCOUNTER — Ambulatory Visit (INDEPENDENT_AMBULATORY_CARE_PROVIDER_SITE_OTHER): Payer: BLUE CROSS/BLUE SHIELD

## 2018-02-15 DIAGNOSIS — J309 Allergic rhinitis, unspecified: Secondary | ICD-10-CM | POA: Diagnosis not present

## 2018-02-20 ENCOUNTER — Ambulatory Visit (INDEPENDENT_AMBULATORY_CARE_PROVIDER_SITE_OTHER): Payer: BLUE CROSS/BLUE SHIELD

## 2018-02-20 DIAGNOSIS — J309 Allergic rhinitis, unspecified: Secondary | ICD-10-CM | POA: Diagnosis not present

## 2018-02-28 ENCOUNTER — Ambulatory Visit (INDEPENDENT_AMBULATORY_CARE_PROVIDER_SITE_OTHER): Payer: BLUE CROSS/BLUE SHIELD

## 2018-02-28 DIAGNOSIS — J309 Allergic rhinitis, unspecified: Secondary | ICD-10-CM

## 2018-03-12 ENCOUNTER — Ambulatory Visit (INDEPENDENT_AMBULATORY_CARE_PROVIDER_SITE_OTHER): Payer: BLUE CROSS/BLUE SHIELD

## 2018-03-12 DIAGNOSIS — J309 Allergic rhinitis, unspecified: Secondary | ICD-10-CM

## 2018-03-18 ENCOUNTER — Ambulatory Visit (INDEPENDENT_AMBULATORY_CARE_PROVIDER_SITE_OTHER): Payer: BLUE CROSS/BLUE SHIELD

## 2018-03-18 DIAGNOSIS — J309 Allergic rhinitis, unspecified: Secondary | ICD-10-CM

## 2018-03-27 ENCOUNTER — Encounter: Payer: Self-pay | Admitting: *Deleted

## 2018-03-27 NOTE — Progress Notes (Signed)
Maintenance vial made. Exp: 03-28-19. hv 

## 2018-03-28 ENCOUNTER — Telehealth: Payer: Self-pay

## 2018-03-28 ENCOUNTER — Ambulatory Visit (INDEPENDENT_AMBULATORY_CARE_PROVIDER_SITE_OTHER): Payer: BLUE CROSS/BLUE SHIELD

## 2018-03-28 ENCOUNTER — Ambulatory Visit: Payer: Self-pay

## 2018-03-28 DIAGNOSIS — J309 Allergic rhinitis, unspecified: Secondary | ICD-10-CM

## 2018-03-28 NOTE — Telephone Encounter (Signed)
I do not know any allergist, by name or reputation, to recommend. If she has friends or co-workers out there, they may be able to help direct her.

## 2018-03-28 NOTE — Telephone Encounter (Signed)
Informed pt .

## 2018-03-28 NOTE — Telephone Encounter (Signed)
[place] next week and was wondering if you have an recommendations for an allergist in that area.

## 2018-03-28 NOTE — Telephone Encounter (Signed)
Pt is coming in for injection today I will inform her in person of this

## 2018-07-04 ENCOUNTER — Ambulatory Visit: Payer: BLUE CROSS/BLUE SHIELD | Admitting: Allergy and Immunology

## 2018-08-10 ENCOUNTER — Other Ambulatory Visit: Payer: Self-pay | Admitting: Allergy and Immunology

## 2018-08-10 DIAGNOSIS — J3089 Other allergic rhinitis: Secondary | ICD-10-CM

## 2018-08-10 DIAGNOSIS — H1010 Acute atopic conjunctivitis, unspecified eye: Secondary | ICD-10-CM

## 2018-10-10 IMAGING — DX DG PELVIS 1-2V
3 series · 3 of 3 positions shown · non-contrast
Comparison: None.

CLINICAL DATA: Low back pain. Hip and pelvis pain. Symptoms since
April 2017. Evaluate for coccyx fracture.

EXAM:
PELVIS - 1-2 VIEW

[standing ap pelvis]
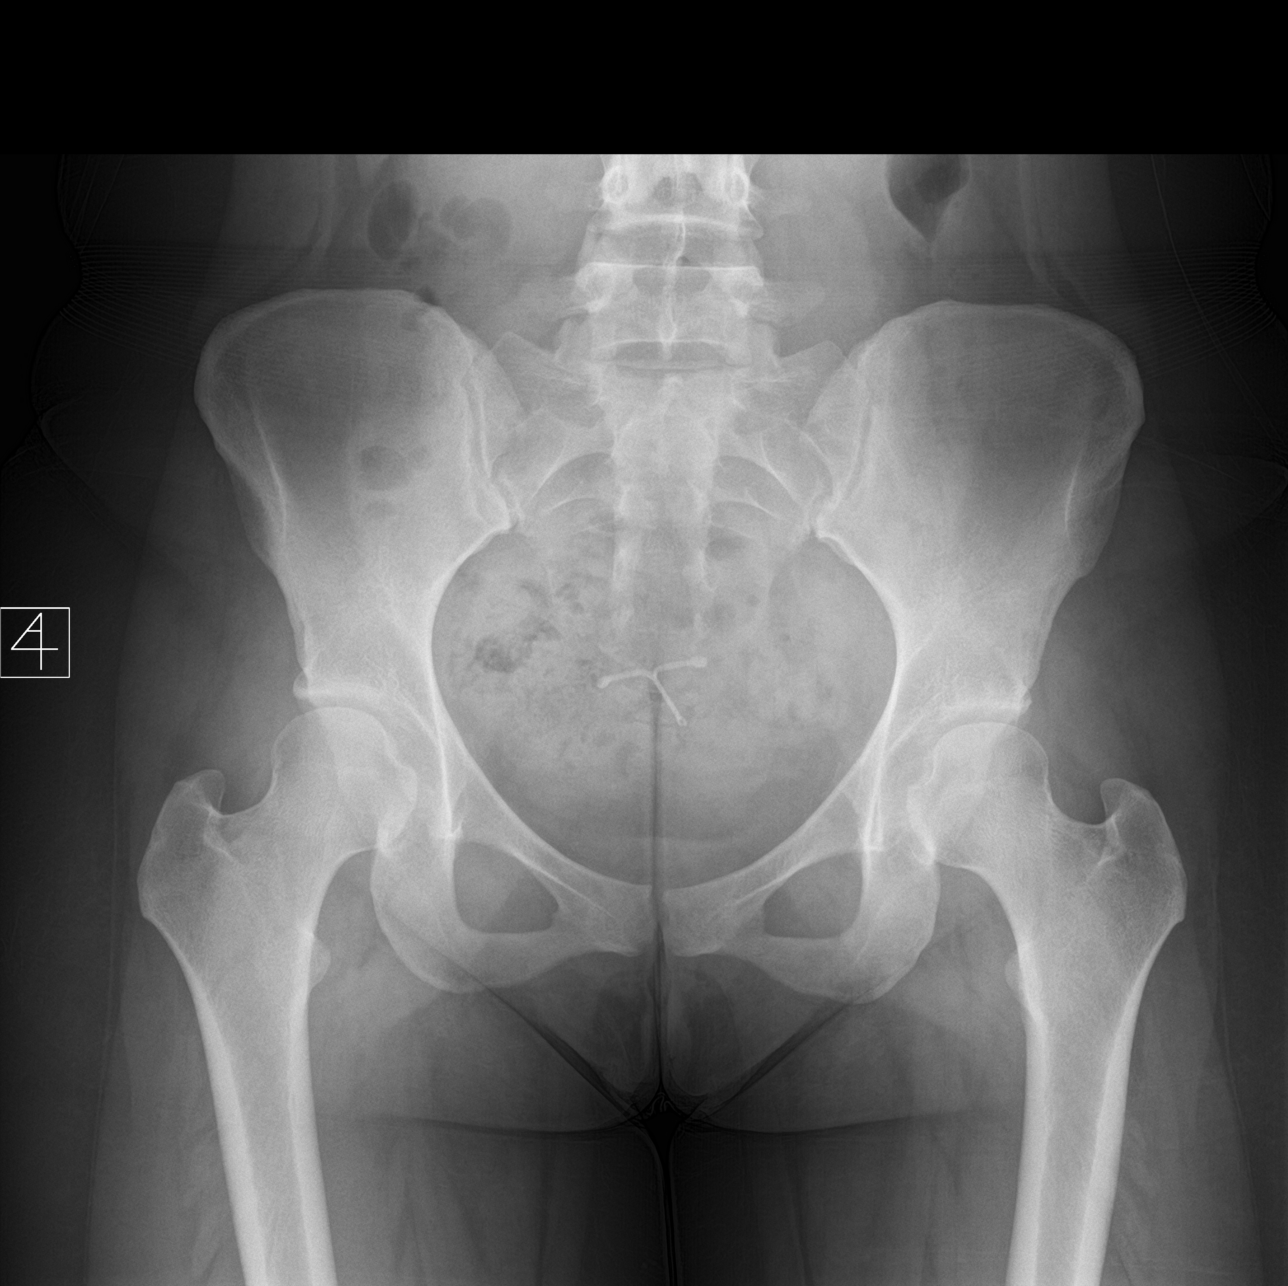

[pelvis ap (1 of 2)]
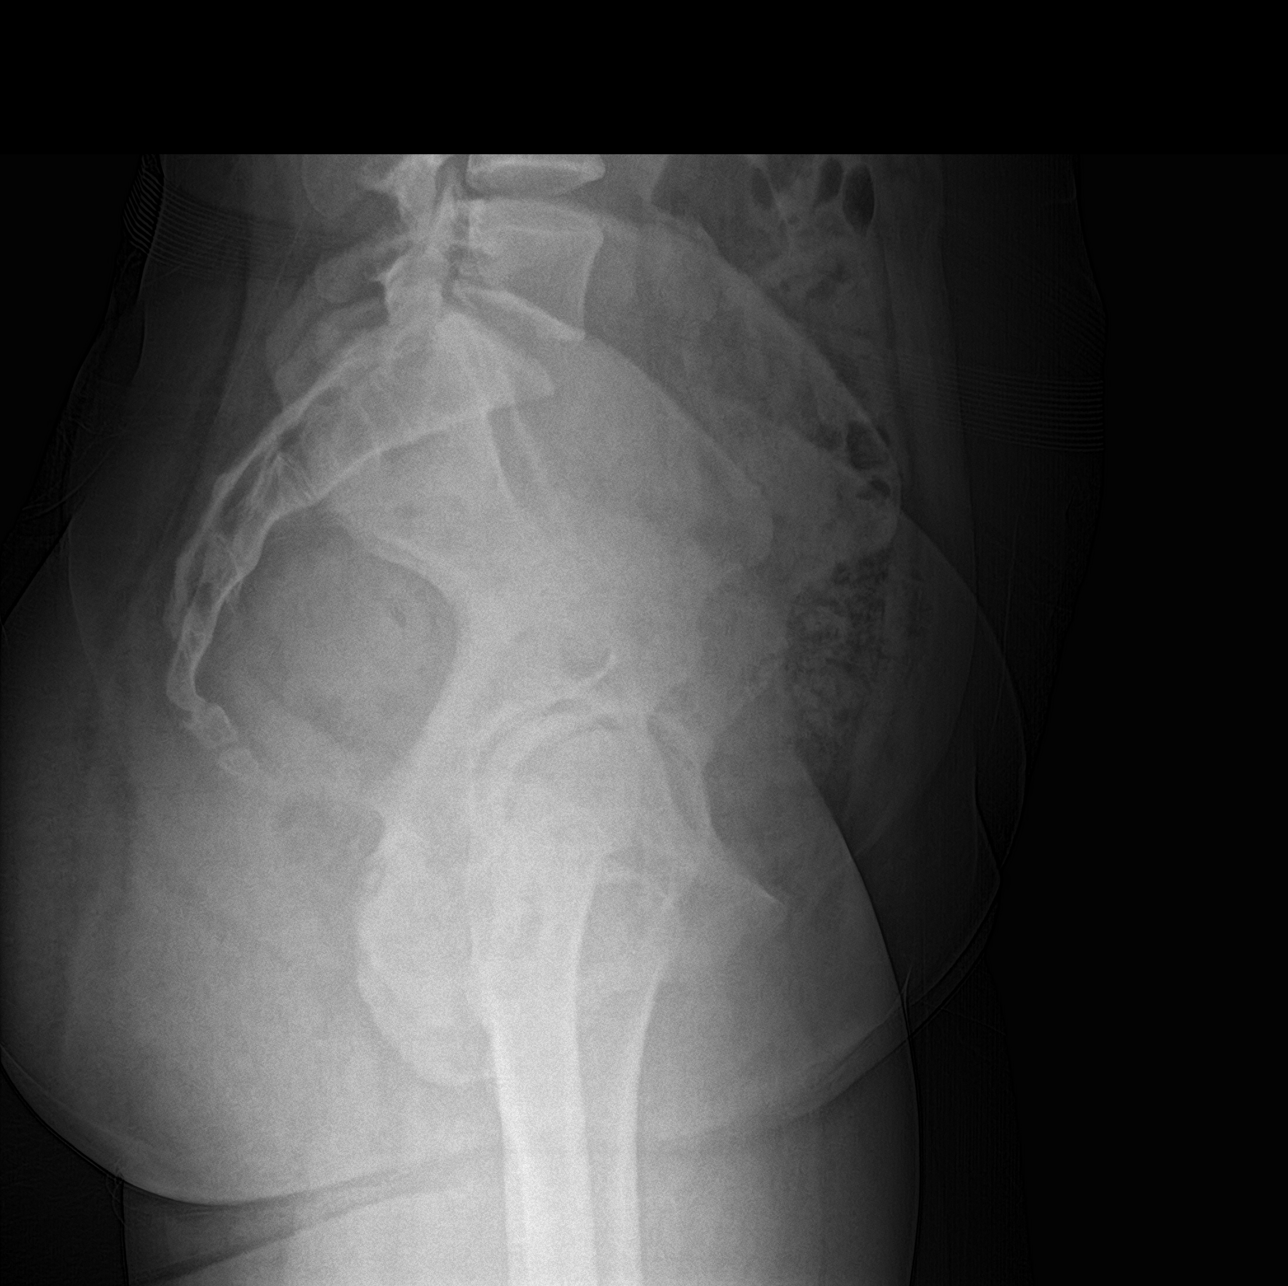

[pelvis ap (2 of 2)]
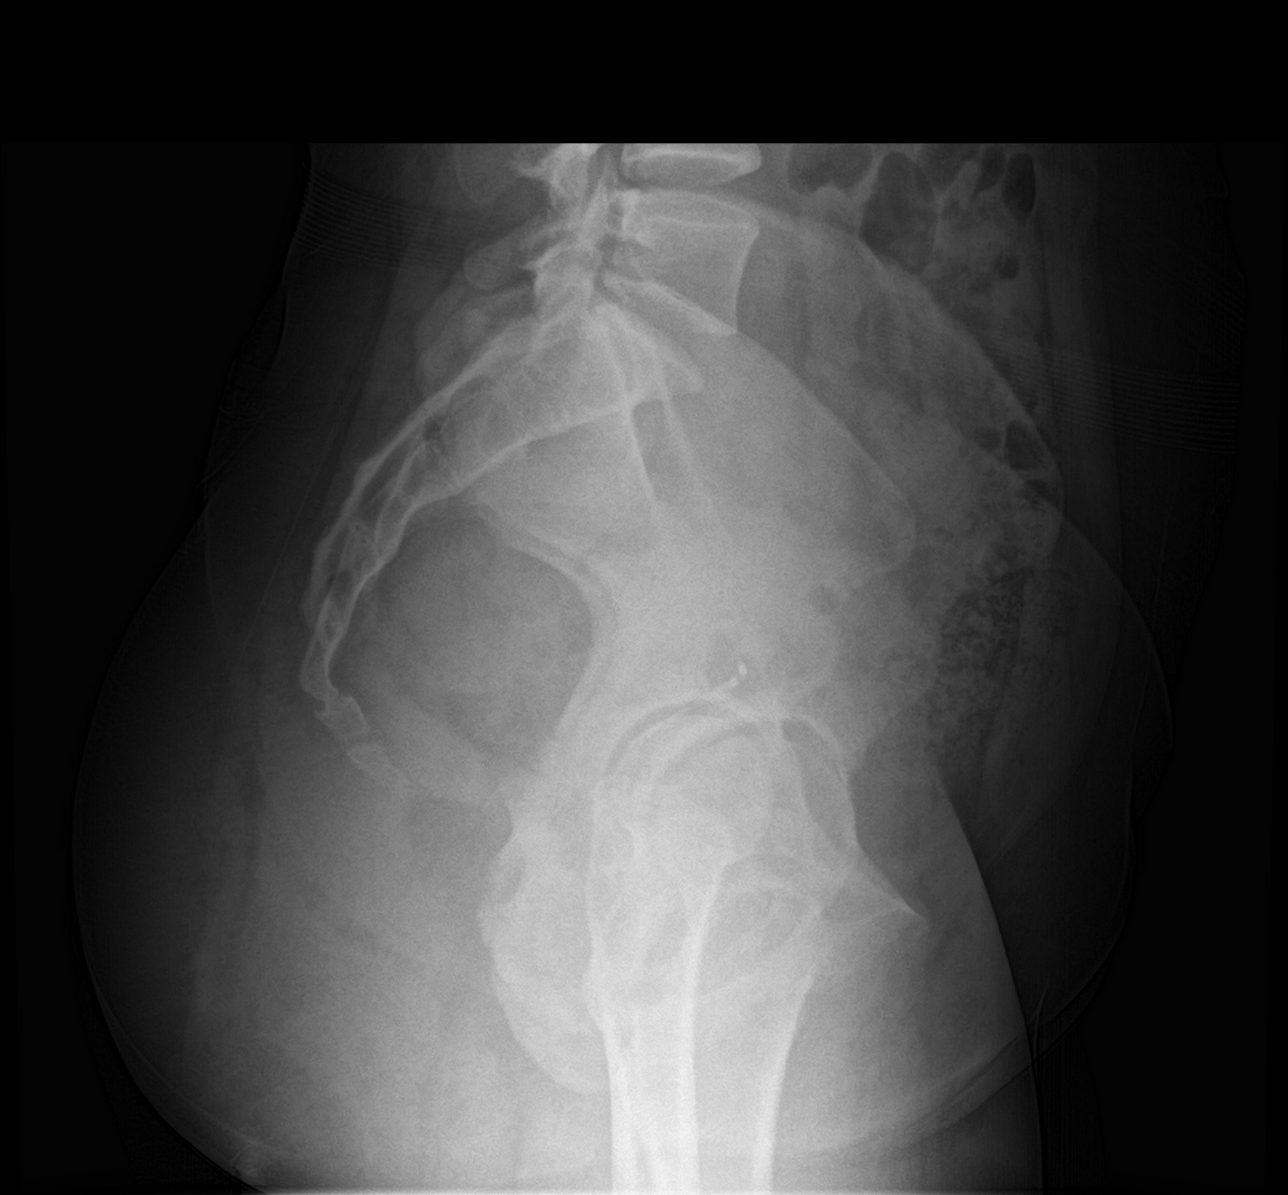

[3 of 3 positions shown; findings below may reference images not displayed]

FINDINGS: Symmetric negative sacroiliac joints. No degenerative, erosive, or
sclerotic changes. Symmetric negative hips. IUD in the central
pelvis.
IMPRESSION: Negative pelvis.
